# Patient Record
Sex: Male | Born: 1935 | Race: White | Hispanic: No | Marital: Married | Smoking: Former smoker
Health system: Southern US, Community
[De-identification: ages and names within clinical notes are randomized; demographics above are authoritative.]

## PROBLEM LIST (undated history)

## (undated) DIAGNOSIS — N400 Enlarged prostate without lower urinary tract symptoms: Secondary | ICD-10-CM

---

## 2008-04-15 ENCOUNTER — Encounter: Admission: RE | Admit: 2008-04-15 | Discharge: 2008-04-15 | Payer: Self-pay | Admitting: Gastroenterology

## 2009-03-18 ENCOUNTER — Encounter: Admission: RE | Admit: 2009-03-18 | Discharge: 2009-03-18 | Payer: Self-pay | Admitting: Family Medicine

## 2009-04-11 ENCOUNTER — Encounter: Admission: RE | Admit: 2009-04-11 | Discharge: 2009-04-11 | Payer: Self-pay | Admitting: Family Medicine

## 2010-06-10 ENCOUNTER — Emergency Department (HOSPITAL_BASED_OUTPATIENT_CLINIC_OR_DEPARTMENT_OTHER): Admission: EM | Admit: 2010-06-10 | Discharge: 2010-06-11 | Payer: Self-pay | Admitting: Emergency Medicine

## 2018-06-01 ENCOUNTER — Emergency Department (HOSPITAL_BASED_OUTPATIENT_CLINIC_OR_DEPARTMENT_OTHER)
Admission: EM | Admit: 2018-06-01 | Discharge: 2018-06-01 | Disposition: A | Discharge status: Home / Self Care | Payer: Medicare Other | Attending: Emergency Medicine | Admitting: Emergency Medicine

## 2018-06-01 ENCOUNTER — Emergency Department (HOSPITAL_BASED_OUTPATIENT_CLINIC_OR_DEPARTMENT_OTHER): Payer: Medicare Other

## 2018-06-01 ENCOUNTER — Encounter (HOSPITAL_BASED_OUTPATIENT_CLINIC_OR_DEPARTMENT_OTHER): Payer: Self-pay | Admitting: Emergency Medicine

## 2018-06-01 ENCOUNTER — Other Ambulatory Visit: Payer: Self-pay

## 2018-06-01 DIAGNOSIS — Z87891 Personal history of nicotine dependence: Secondary | ICD-10-CM | POA: Diagnosis not present

## 2018-06-01 DIAGNOSIS — R61 Generalized hyperhidrosis: Secondary | ICD-10-CM | POA: Insufficient documentation

## 2018-06-01 DIAGNOSIS — R531 Weakness: Secondary | ICD-10-CM | POA: Diagnosis present

## 2018-06-01 DIAGNOSIS — Z85828 Personal history of other malignant neoplasm of skin: Secondary | ICD-10-CM | POA: Diagnosis not present

## 2018-06-01 DIAGNOSIS — N3001 Acute cystitis with hematuria: Secondary | ICD-10-CM | POA: Diagnosis not present

## 2018-06-01 HISTORY — DX: Benign prostatic hyperplasia without lower urinary tract symptoms: N40.0

## 2018-06-01 LAB — URINALYSIS, ROUTINE W REFLEX MICROSCOPIC
BILIRUBIN URINE: NEGATIVE
Glucose, UA: NEGATIVE mg/dL
Ketones, ur: NEGATIVE mg/dL
Nitrite: NEGATIVE
PH: 6 (ref 5.0–8.0)
Protein, ur: 30 mg/dL — AB
SPECIFIC GRAVITY, URINE: 1.01 (ref 1.005–1.030)

## 2018-06-01 LAB — CBC
HCT: 37.3 % — ABNORMAL LOW (ref 39.0–52.0)
Hemoglobin: 12.9 g/dL — ABNORMAL LOW (ref 13.0–17.0)
MCH: 33.3 pg (ref 26.0–34.0)
MCHC: 34.6 g/dL (ref 30.0–36.0)
MCV: 96.4 fL (ref 78.0–100.0)
Platelets: 210 10*3/uL (ref 150–400)
RBC: 3.87 MIL/uL — ABNORMAL LOW (ref 4.22–5.81)
RDW: 12 % (ref 11.5–15.5)
WBC: 5.5 10*3/uL (ref 4.0–10.5)

## 2018-06-01 LAB — COMPREHENSIVE METABOLIC PANEL
ALK PHOS: 43 U/L (ref 38–126)
ALT: 19 U/L (ref 0–44)
AST: 22 U/L (ref 15–41)
Albumin: 3.1 g/dL — ABNORMAL LOW (ref 3.5–5.0)
Anion gap: 7 (ref 5–15)
BUN: 20 mg/dL (ref 8–23)
CALCIUM: 9.2 mg/dL (ref 8.9–10.3)
CO2: 27 mmol/L (ref 22–32)
CREATININE: 0.68 mg/dL (ref 0.61–1.24)
Chloride: 100 mmol/L (ref 98–111)
Glucose, Bld: 154 mg/dL — ABNORMAL HIGH (ref 70–99)
Potassium: 3.6 mmol/L (ref 3.5–5.1)
Sodium: 134 mmol/L — ABNORMAL LOW (ref 135–145)
Total Bilirubin: 0.7 mg/dL (ref 0.3–1.2)
Total Protein: 6.5 g/dL (ref 6.5–8.1)

## 2018-06-01 LAB — URINALYSIS, MICROSCOPIC (REFLEX): WBC, UA: >50 WBC/hpf (ref 0–5)

## 2018-06-01 LAB — TROPONIN I: Troponin I: <0.03 ng/mL (ref <0.03)

## 2018-06-01 LAB — TSH: TSH: 1.041 u[IU]/mL (ref 0.350–4.500)

## 2018-06-01 LAB — LIPASE, BLOOD: LIPASE: 31 U/L (ref 11–51)

## 2018-06-01 MED ORDER — CEPHALEXIN 500 MG PO CAPS: 500.0000 mg | ORAL_CAPSULE | Freq: Two times a day (BID) | ORAL | 0 refills | Status: AC

## 2018-06-01 NOTE — ED Provider Notes (Signed)
Donald Conrad Provider Note   CSN: [id number] Arrival date & time: 06/01/18  1130     History   Chief Complaint Chief Complaint  Patient presents with  . Weakness    HPI Donald Conrad is a 84 y.o. male past medical history of GERD, BPH, squamous cell carcinoma of the head who presents for evaluation of 3 weeks of generalized weakness, increased fatigue, night sweats, decreased appetite.  Patient reports that on 05/12/2018, he was treated for UTI.  His culture showed resistance to Bactrim which he had been on so he was switched to Keflex.  Patient reports that since then, he has not been back to baseline.  He states that he has no energy and is very tired.  Patient reports that he has had a frequent night sweats and states that throughout his night he has to change his pajamas several times.  Patient reports that he has not wanted to eat or drink anything.  He denies any nausea/vomiting or abdominal pain.  Patient reports subjective fever chills that is been intermittent for the last 3 weeks.  He states he has not actually measured a temperature but just felt like he has not been able to regulate.  Patient also reports that he has had increased urinary frequency and states that his urine has been very cloudy.  Patient also states that he feels very unsteady on his feet like he might fall down.  He denies any room spinning sensation.  Patient denies any chest pain, difficulty breathing, abdominal pain, nausea/vomiting, vision changes, speech difficulty, numbness/weakness of his arms or legs.  The history is provided by the patient.    Past Medical History:  Diagnosis Date  . Enlarged prostate     There are no active problems to display for this patient.   History reviewed. No pertinent surgical history.      Home Medications    Prior to Admission medications   Medication Sig Start Date End Date Taking? Authorizing Provider  cephALEXin (KEFLEX) 500 MG  capsule Take 1 capsule (500 mg total) by mouth 2 (two) times daily for 7 days. 06/01/18 06/08/18  Volanda Napoleon, PA-C    Family History No family history on file.  Social History Social History   Tobacco Use  . Smoking status: Former Research scientist (life sciences)  . Smokeless tobacco: Never Used  Substance Use Topics  . Alcohol use: Yes    Alcohol/week: 1.8 oz    Types: 3 Glasses of wine per week    Comment: daily  . Drug use: Never     Allergies   Bactrim [sulfamethoxazole-trimethoprim]   Review of Systems Review of Systems  Constitutional: Positive for appetite change, chills, diaphoresis, fatigue and fever.  HENT: Negative for congestion.   Eyes: Negative for visual disturbance.  Respiratory: Negative for cough and shortness of breath.   Cardiovascular: Negative for chest pain.  Gastrointestinal: Negative for abdominal pain, diarrhea, nausea and vomiting.  Genitourinary: Positive for frequency. Negative for dysuria and hematuria.  Musculoskeletal: Negative for back pain and neck pain.  Neurological: Positive for light-headedness. Negative for dizziness, weakness, numbness and headaches.  All other systems reviewed and are negative.    Physical Exam Updated Vital Signs BP 132/64   Pulse (!) 57   Temp 98.2 F (36.8 C)   Resp 11   Ht 5' 7.75" (1.721 m)   Wt 60.3 kg (133 lb)   SpO2 100%   BMI 20.37 kg/m   Physical Exam  Constitutional: He  is oriented to person, place, and time. He appears well-developed and well-nourished.  HENT:  Head: Normocephalic and atraumatic.  Mouth/Throat: Oropharynx is clear and moist and mucous membranes are normal.  Eyes: Pupils are equal, round, and reactive to light. Conjunctivae, EOM and lids are normal.  Neck: Full passive range of motion without pain.  Cardiovascular: Normal rate, regular rhythm, normal heart sounds and normal pulses. Exam reveals no gallop and no friction rub.  No murmur heard. Pulmonary/Chest: Effort normal and breath sounds  normal.  Lungs clear to auscultation bilaterally.  Symmetric chest rise.  No wheezing, rales, rhonchi.  Abdominal: Soft. Normal appearance and bowel sounds are normal. There is no tenderness. There is no rigidity and no guarding.  Slight abdominal protuberance.   Musculoskeletal: Normal range of motion.  Neurological: He is alert and oriented to person, place, and time.  Cranial nerves III-XII intact Follows commands, Moves all extremities  5/5 strength to BUE and BLE  Sensation intact throughout all major nerve distributions Normal finger to nose. No dysdiadochokinesia. No pronator drift. No gait abnormalities  No slurred speech. No facial droop.   Skin: Skin is warm and dry. Capillary refill takes less than 2 seconds.  Psychiatric: He has a normal mood and affect. His speech is normal.  Nursing note and vitals reviewed.    ED Treatments / Results  Labs (all labs ordered are listed, but only abnormal results are displayed) Labs Reviewed  CBC - Abnormal; Notable for the following components:      Result Value   RBC 3.87 (*)    Hemoglobin 12.9 (*)    HCT 37.3 (*)    All other components within normal limits  URINALYSIS, ROUTINE W REFLEX MICROSCOPIC - Abnormal; Notable for the following components:   APPearance CLOUDY (*)    Hgb urine dipstick MODERATE (*)    Protein, ur 30 (*)    Leukocytes, UA LARGE (*)    All other components within normal limits  COMPREHENSIVE METABOLIC PANEL - Abnormal; Notable for the following components:   Sodium 134 (*)    Glucose, Bld 154 (*)    Albumin 3.1 (*)    All other components within normal limits  URINALYSIS, MICROSCOPIC (REFLEX) - Abnormal; Notable for the following components:   Bacteria, UA MANY (*)    All other components within normal limits  URINE CULTURE  TROPONIN I  LIPASE, BLOOD  TSH    EKG EKG Interpretation  Date/Time:  Sunday June 01 2018 11:45:19 EDT Ventricular Rate:  75 PR Interval:  166 QRS Duration: 90 QT  Interval:  348 QTC Calculation: 388 R Axis:   44 Text Interpretation:  Normal sinus rhythm Increased R/S ratio in V1, consider early transition or posterior infarct Abnormal ECG When compared to prior, no significant changes seen.  No STEMI Confirmed by Antony Blackbird [phone]) on 06/01/2018 11:48:51 AM Also confirmed by Antony Blackbird [phone]), editor Lynder Parents [phone])  on 06/01/2018 11:54:15 AM   Radiology Dg Chest 2 View  Result Date: 06/01/2018 CLINICAL DATA:  Weakness and fatigue.  Urinary tract infection. EXAM: CHEST - 2 VIEW COMPARISON:  03/18/2009 FINDINGS: Thoracic spondylosis. The lungs appear clear. Cardiac and mediastinal margins appear normal. IMPRESSION: 1.  No active cardiopulmonary disease is radiographically apparent. 2. Thoracic spondylosis. Electronically Signed   By: Van Clines M.D.   On: 06/01/2018 13:06   Ct Head Wo Contrast  Result Date: 06/01/2018 CLINICAL DATA:  Weakness and fatigue. EXAM: CT HEAD WITHOUT CONTRAST TECHNIQUE: Contiguous axial images  were obtained from the base of the skull through the vertex without intravenous contrast. COMPARISON:  None. FINDINGS: Brain: Calcifications observed along the tentorium and falx. The brainstem, cerebellum, cerebral peduncles, thalami, basal ganglia, basilar cisterns, and ventricular system appear within normal limits. No intracranial hemorrhage, mass lesion, or acute CVA. Vascular: Unremarkable Skull: Unremarkable Sinuses/Orbits: Partial visualization of a suspected mucous retention cyst in left maxillary sinus on image 1/3. Mild chronic ethmoid sinusitis. Other: No supplemental non-categorized findings. IMPRESSION: 1. No significant intracranial abnormalities. 2. Chronic ethmoid sinusitis with a suspected mucous retention cyst in the left maxillary sinus. Electronically Signed   By: Van Clines M.D.   On: 06/01/2018 13:00    Procedures Procedures (including critical care time)  Medications Ordered in  ED Medications - No data to display   Initial Impression / Assessment and Plan / ED Course  I have reviewed the triage vital signs and the nursing notes.  Pertinent labs & imaging results that were available during my care of the patient were reviewed by me and considered in my medical decision making (see chart for details).     84 year old male who presents for evaluation of 3 weeks of generalized fatigue, decreased appetite, night sweats, subjective fever/chills.  Reports that since he had a UTI on 05/12/2017, he has not felt the same.  No focal weakness, vision changes, difficulty speaking.  No chest pain, difficulty breathing. Patient is afebrile, non-toxic appearing, sitting comfortably on examination table. Vital signs reviewed and stable. No neuro deficits noted on exam.  Lungs clear to auscultation.  Abdomen is slightly protuberant but no tenderness.  Will check basic labs, EKG, troponin, chest x-ray.  Given complaints of unsteadiness, plan CT head for further evaluation.  Troponin negative.  Lipase unremarkable.  CBC without any significant leukocytosis.  Hemoglobin is 12.9 and hematocrit is 37.3.  Platelets are 210.  CMP shows sodium of 134, hyperglycemia with glucose at 154.  UA shows moderate hemoglobin, large leukocytes, pyuria.  Chest x-ray shows no acute infectious etiology.  CT head negative for any acute intracranial abnormality.  Discussed patient with Dr. Sherry Ruffing who independently evaluated patient.  Patient does have hematuria noted on UA.  He does report that he has had a history of kidney stones but is not having any pain.  We discussed at length regarding further evaluation, including CT abdomen pelvis for further evaluation.  Patient wishes not to proceed with any further work-up here in the ED.  We will plan to treat for his UTI.  Urine culture sent.  Previous urine culture was susceptible to Keflex.  We will start him on Keflex.  Encourage primary care follow-up. Patient had  ample opportunity for questions and discussion. All patient's questions were answered with full understanding.   Final Clinical Impressions(s) / ED Diagnoses   Final diagnoses:  Acute cystitis with hematuria    ED Discharge Orders        Ordered    cephALEXin (KEFLEX) 500 MG capsule  2 times daily     06/01/18 1430       Volanda Napoleon, PA-C 06/01/18 1454    Tegeler, Gwenyth Allegra, MD 06/02/18 [phone]

## 2018-06-01 NOTE — Discharge Instructions (Signed)
Take antibiotics as directed. Please take all of your antibiotics until finished.  We have sent your urine for a culture so if you need to be switched on your antibiotics, you will be notified.   Please follow-up with your primary care doctor in the next 2 to 4 days for further evaluation.  Return to emergency department for any fever, abdominal pain, nausea/vomiting, chest pain, difficulty breathing or any other worsening or concerning symptoms.

## 2018-06-01 NOTE — ED Triage Notes (Signed)
Pt c/o generalized weakness, fatigue, night sweats, decreased appetite for 3 weeks. Denies chest pain, SOB, N/V

## 2018-06-04 LAB — URINE CULTURE
Culture: >=100000 — AB
Special Requests: NORMAL

## 2018-06-05 ENCOUNTER — Telehealth: Payer: Self-pay | Admitting: *Deleted

## 2018-06-05 NOTE — Telephone Encounter (Signed)
Post ED Visit - Positive Culture Follow-up  Culture report reviewed by antimicrobial stewardship pharmacist:  []  Elenor Quinones, Pharm.D. []  Heide Guile, Pharm.D., BCPS AQ-ID []  Parks Neptune, Pharm.D., BCPS []  Alycia Rossetti, Pharm.D., BCPS []  Kirby, Pharm.D., BCPS, AAHIVP []  Legrand Como, Pharm.D., BCPS, AAHIVP []  Salome Arnt, PharmD, BCPS []  Johnnette Gourd, PharmD, BCPS []  Hughes Better, PharmD, BCPS []  Leeroy Cha, PharmD Lucio Edward, PharmD  Positive urine culture Treated with Cephalexin, organism sensitive to the same and no further patient follow-up is required at this time.  Harlon Flor [facility] 06/05/2018, 10:30 AM

## 2019-01-21 ENCOUNTER — Ambulatory Visit
Admission: RE | Admit: 2019-01-21 | Discharge: 2019-01-21 | Disposition: A | Discharge status: Home / Self Care | Payer: Medicare Other | Source: Ambulatory Visit | Attending: Family Medicine | Admitting: Family Medicine

## 2019-01-21 ENCOUNTER — Other Ambulatory Visit: Payer: Self-pay | Admitting: Family Medicine

## 2019-01-21 ENCOUNTER — Ambulatory Visit: Payer: Medicare Other

## 2019-01-21 DIAGNOSIS — N6310 Unspecified lump in the right breast, unspecified quadrant: Secondary | ICD-10-CM

## 2019-08-02 IMAGING — CR DG CHEST 2V
2 series · 2 of 2 positions shown · non-contrast
Comparison: 03/18/2009

CLINICAL DATA: Weakness and fatigue.  Urinary tract infection.

EXAM:
CHEST - 2 VIEW

[w chest pa]
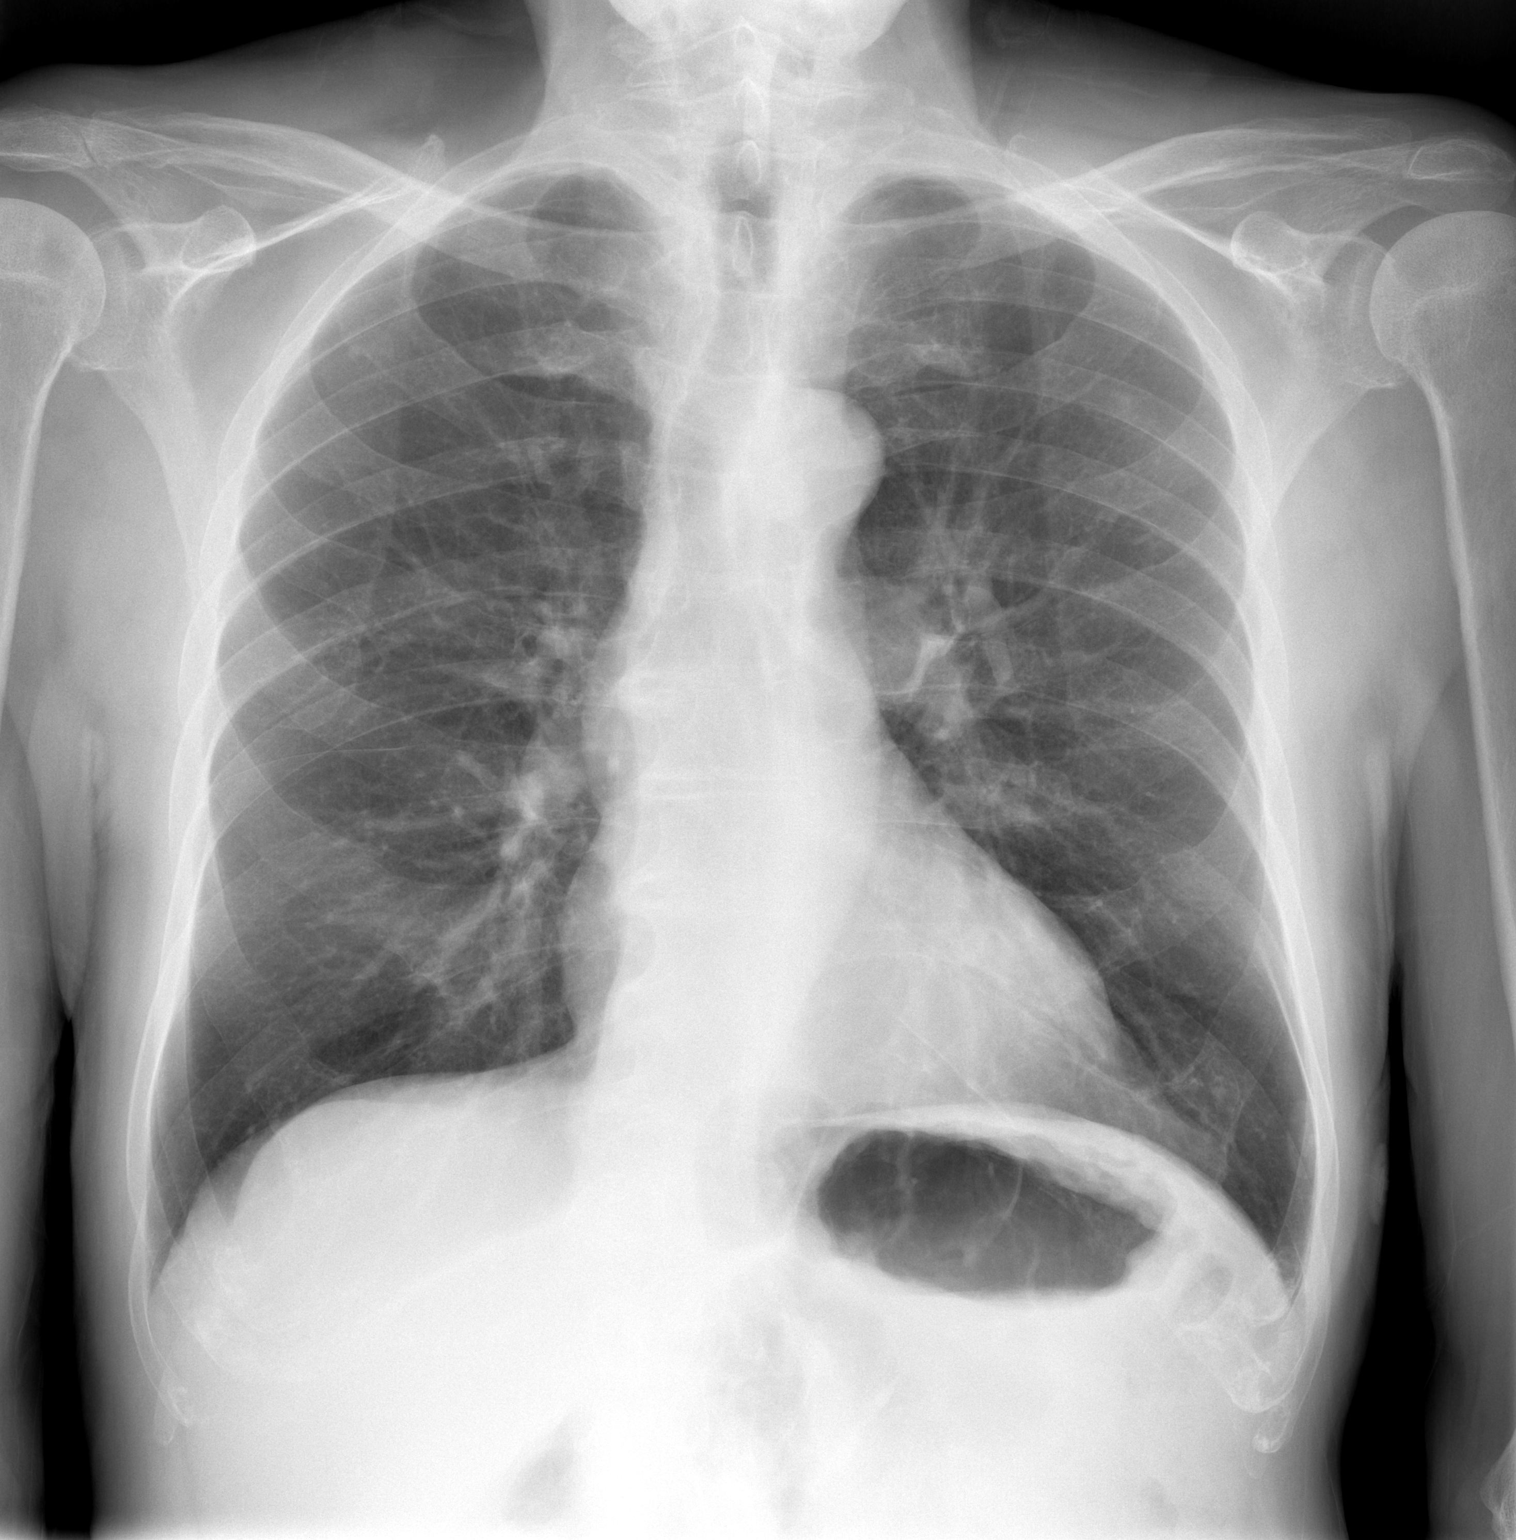

[w chest lat]
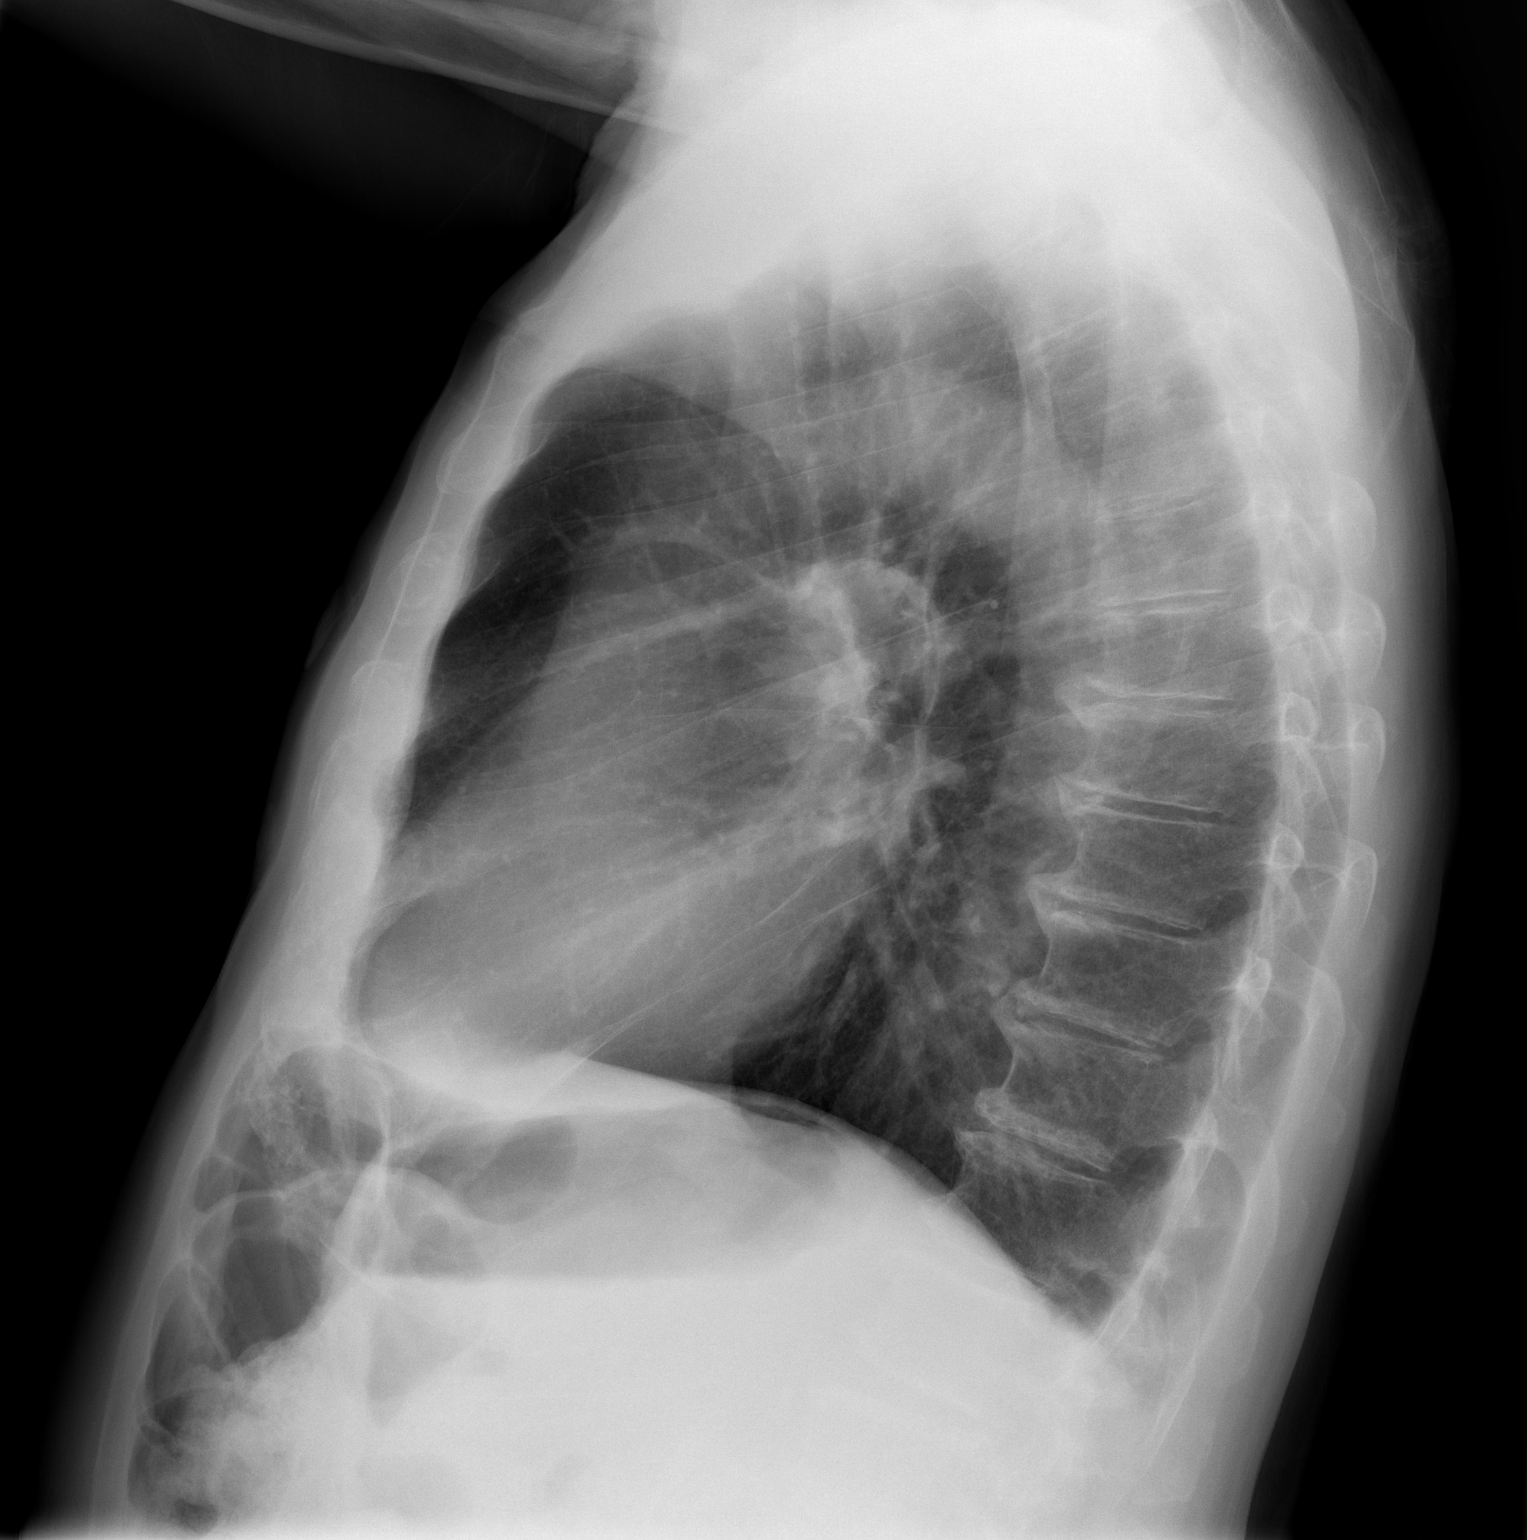

[2 of 2 positions shown; findings below may reference images not displayed]

FINDINGS: Thoracic spondylosis. The lungs appear clear. Cardiac and
mediastinal margins appear normal.
IMPRESSION: 1.  No active cardiopulmonary disease is radiographically apparent.
2. Thoracic spondylosis.

## 2019-08-02 IMAGING — CT CT HEAD W/O CM
3 series · 16 of 47 positions shown, 19 images · non-contrast
Comparison: None.

CLINICAL DATA: Weakness and fatigue.

EXAM:
CT HEAD WITHOUT CONTRAST
TECHNIQUE: Contiguous axial images were obtained from the base of the skull
through the vertex without intravenous contrast.

[Series 2: head wo · axial · 0.44mm/px · z∈[+788,+933]mm · 10 of 35 slices shown, 13 images]
[im 3/35  brain]
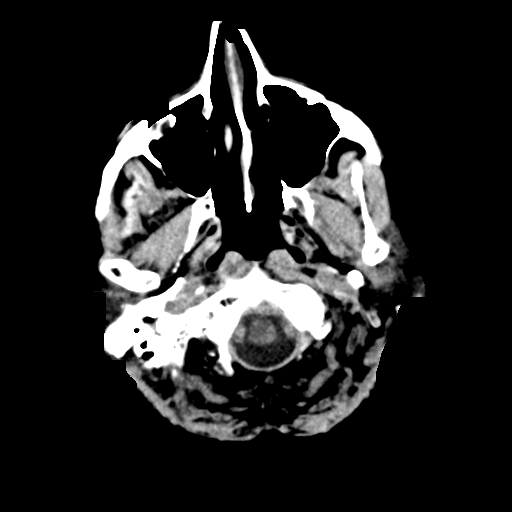
[im 3/35  bone]
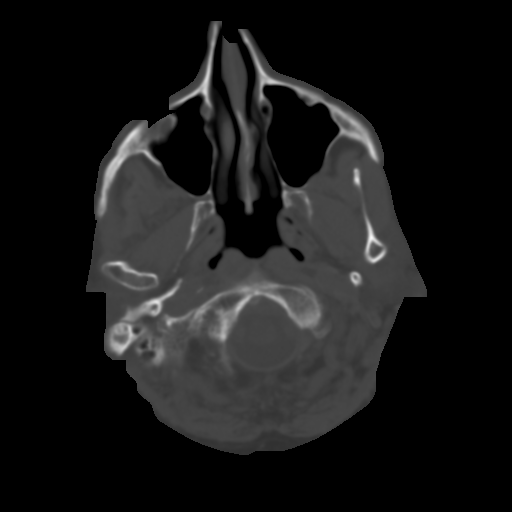
[im 6/35  brain]
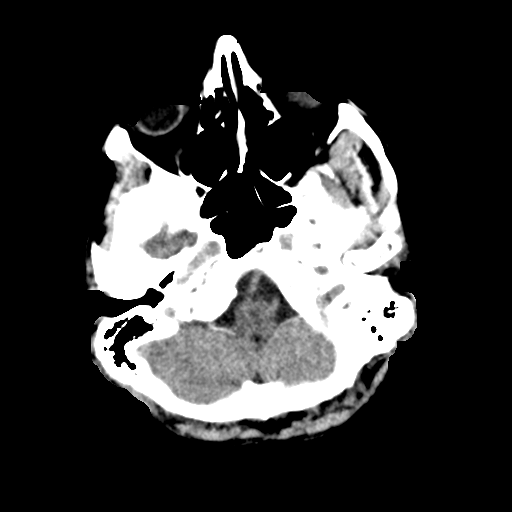
[im 10/35  brain]
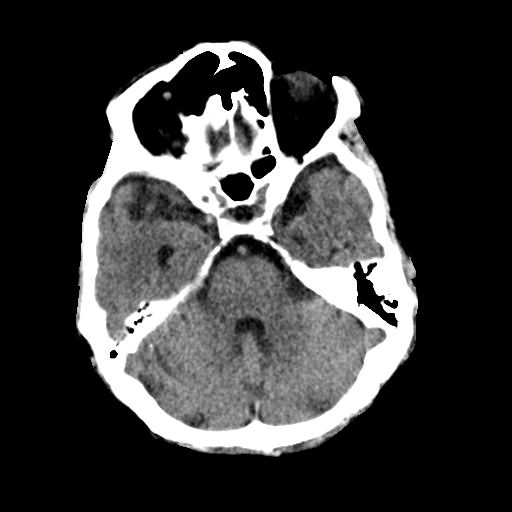
[im 12/35  brain]
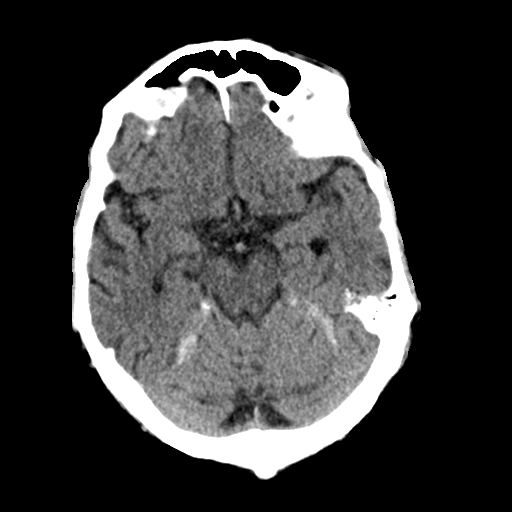
[im 16/35  brain]
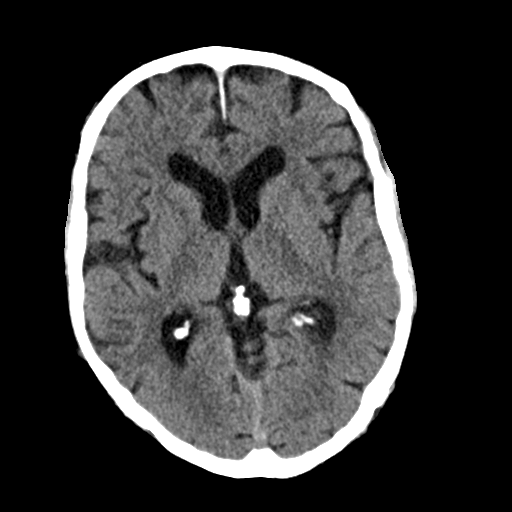
[im 16/35  bone]
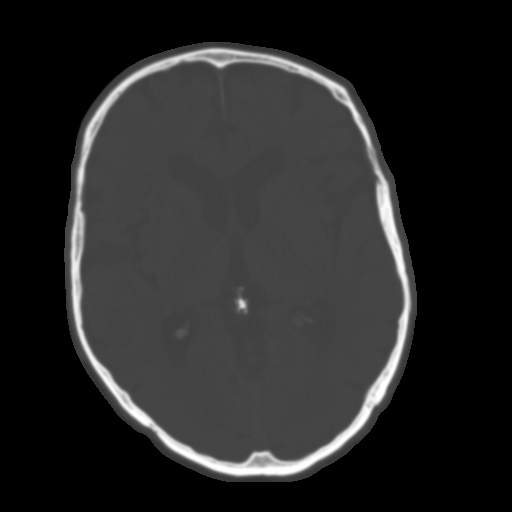
[im 19/35  brain]
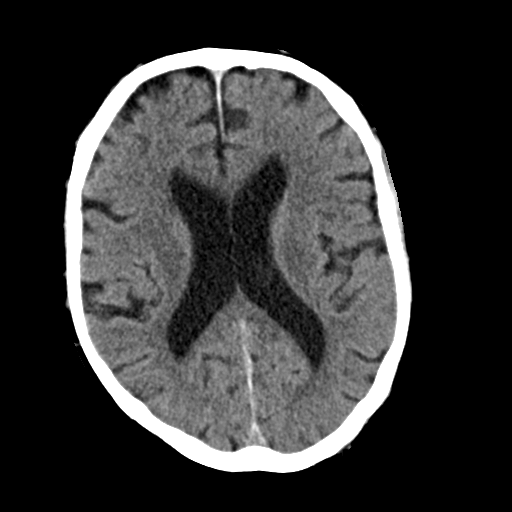
[im 23/35  brain]
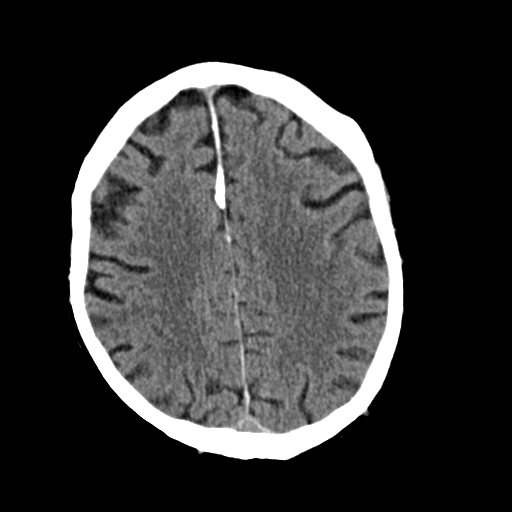
[im 26/35  brain]
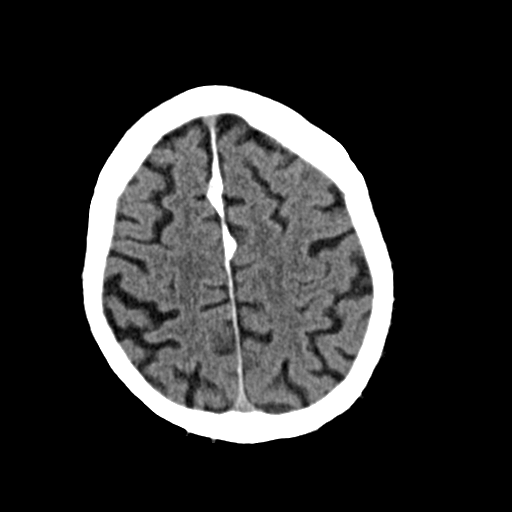
[im 29/35  brain]
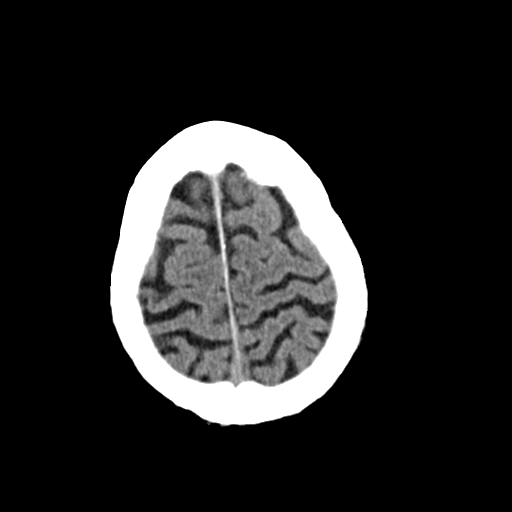
[im 29/35  bone]
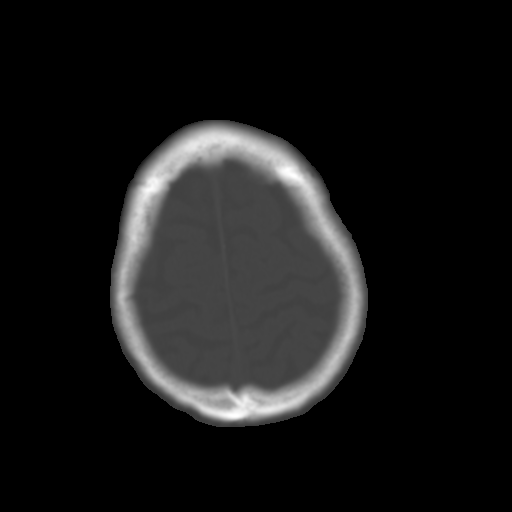
[im 32/35  brain]
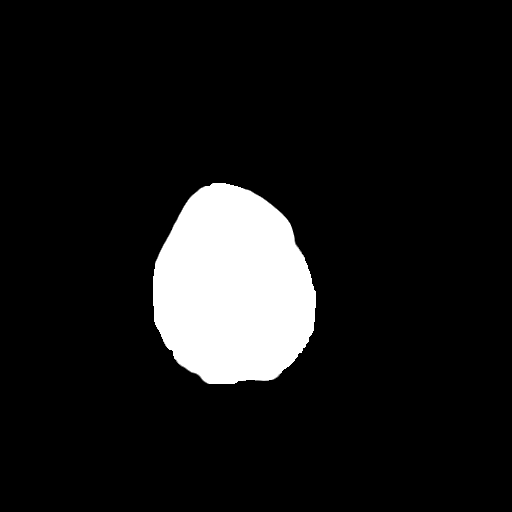

[Series 4: coronal soft · coronal · 0.35mm/px · 3 of 73 slices shown]
[im 25/73  brain]
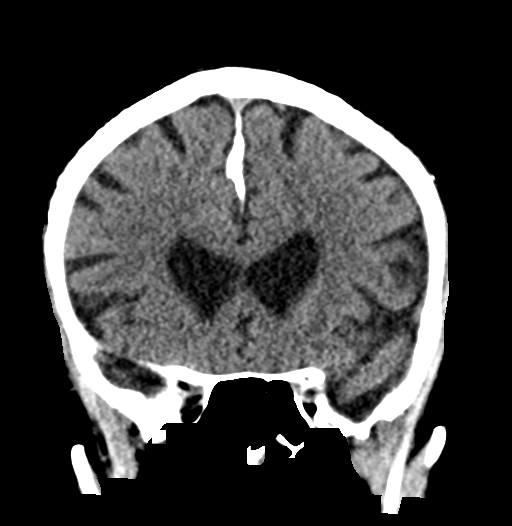
[im 33/73  brain]
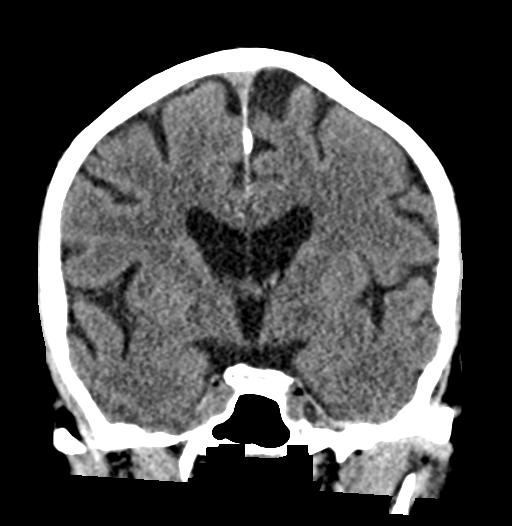
[im 41/73  brain]
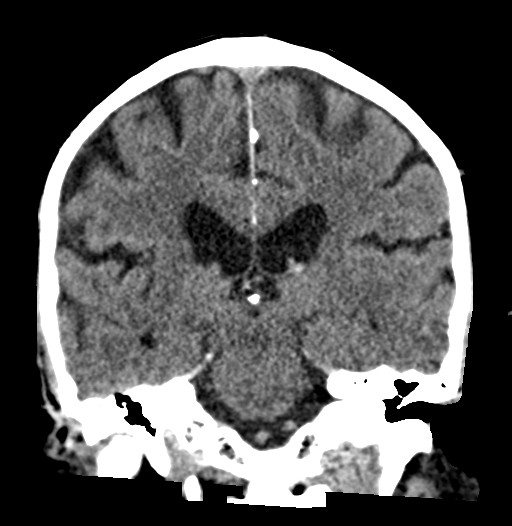

[Series 5: sag soft · sagittal · 0.38mm/px · 3 of 57 slices shown]
[im 19/57  brain]
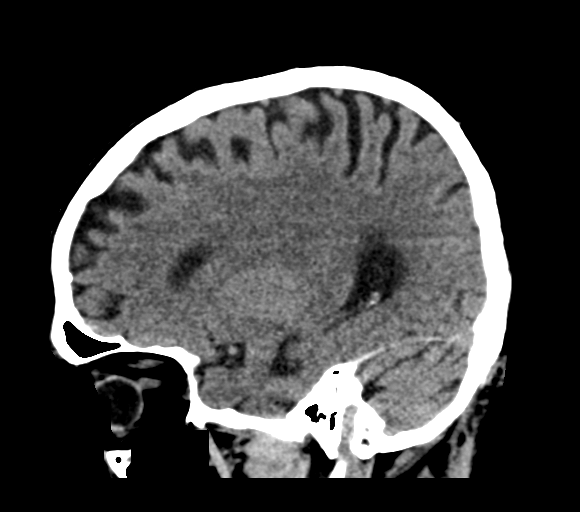
[im 29/57  brain]
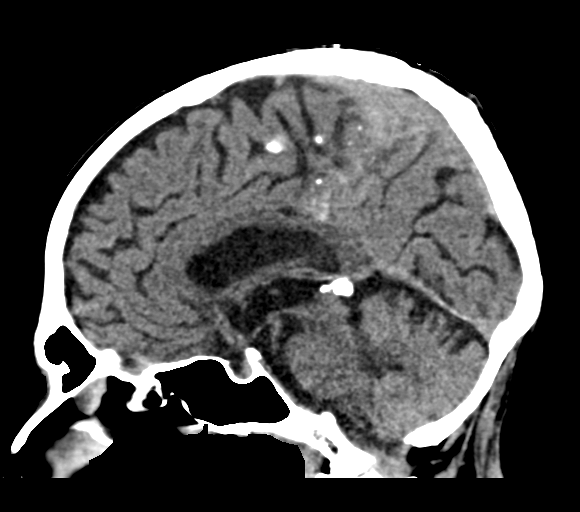
[im 38/57  brain]
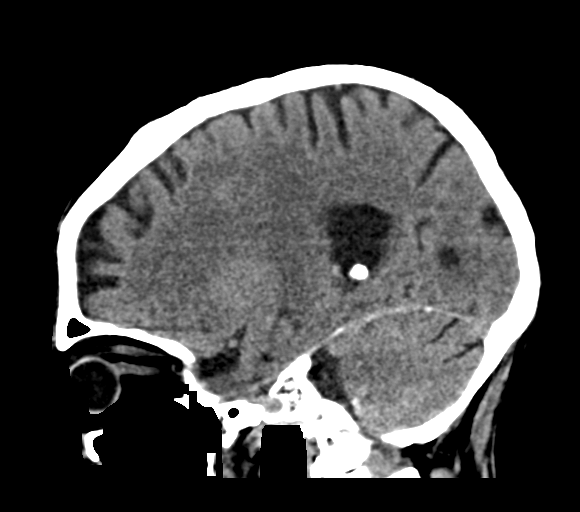

[16 of 47 positions shown; findings below may reference images not displayed]

FINDINGS: Brain: Calcifications observed along the tentorium and falx.

The brainstem, cerebellum, cerebral peduncles, thalami, basal
ganglia, basilar cisterns, and ventricular system appear within
normal limits. No intracranial hemorrhage, mass lesion, or acute
CVA.

Vascular: Unremarkable

Skull: Unremarkable

Sinuses/Orbits: Partial visualization of a suspected mucous
retention cyst in left maxillary sinus on image [DATE]. Mild chronic
ethmoid sinusitis.

Other: No supplemental non-categorized findings.
IMPRESSION: 1. No significant intracranial abnormalities.
2. Chronic ethmoid sinusitis with a suspected mucous retention cyst
in the left maxillary sinus.

## 2021-03-16 DIAGNOSIS — Z85828 Personal history of other malignant neoplasm of skin: Secondary | ICD-10-CM | POA: Diagnosis not present

## 2021-03-16 DIAGNOSIS — L570 Actinic keratosis: Secondary | ICD-10-CM | POA: Diagnosis not present

## 2021-03-16 DIAGNOSIS — D0439 Carcinoma in situ of skin of other parts of face: Secondary | ICD-10-CM | POA: Diagnosis not present

## 2021-03-16 DIAGNOSIS — L821 Other seborrheic keratosis: Secondary | ICD-10-CM | POA: Diagnosis not present

## 2021-03-22 DIAGNOSIS — Z1389 Encounter for screening for other disorder: Secondary | ICD-10-CM | POA: Diagnosis not present

## 2021-03-22 DIAGNOSIS — C801 Malignant (primary) neoplasm, unspecified: Secondary | ICD-10-CM | POA: Diagnosis not present

## 2021-03-22 DIAGNOSIS — I10 Essential (primary) hypertension: Secondary | ICD-10-CM | POA: Diagnosis not present

## 2021-03-22 DIAGNOSIS — E7800 Pure hypercholesterolemia, unspecified: Secondary | ICD-10-CM | POA: Diagnosis not present

## 2021-03-22 DIAGNOSIS — C4491 Basal cell carcinoma of skin, unspecified: Secondary | ICD-10-CM | POA: Diagnosis not present

## 2021-03-22 DIAGNOSIS — K219 Gastro-esophageal reflux disease without esophagitis: Secondary | ICD-10-CM | POA: Diagnosis not present

## 2021-03-22 DIAGNOSIS — Z0000 Encounter for general adult medical examination without abnormal findings: Secondary | ICD-10-CM | POA: Diagnosis not present

## 2021-06-15 DIAGNOSIS — L570 Actinic keratosis: Secondary | ICD-10-CM | POA: Diagnosis not present

## 2021-10-04 DIAGNOSIS — H43393 Other vitreous opacities, bilateral: Secondary | ICD-10-CM | POA: Diagnosis not present

## 2021-10-04 DIAGNOSIS — H43813 Vitreous degeneration, bilateral: Secondary | ICD-10-CM | POA: Diagnosis not present

## 2021-10-04 DIAGNOSIS — H52203 Unspecified astigmatism, bilateral: Secondary | ICD-10-CM | POA: Diagnosis not present

## 2021-10-04 DIAGNOSIS — H35371 Puckering of macula, right eye: Secondary | ICD-10-CM | POA: Diagnosis not present

## 2021-10-04 DIAGNOSIS — H26491 Other secondary cataract, right eye: Secondary | ICD-10-CM | POA: Diagnosis not present

## 2021-10-04 DIAGNOSIS — H524 Presbyopia: Secondary | ICD-10-CM | POA: Diagnosis not present

## 2021-10-04 DIAGNOSIS — Z961 Presence of intraocular lens: Secondary | ICD-10-CM | POA: Diagnosis not present

## 2022-03-27 DIAGNOSIS — E7800 Pure hypercholesterolemia, unspecified: Secondary | ICD-10-CM | POA: Diagnosis not present

## 2022-03-27 DIAGNOSIS — C801 Malignant (primary) neoplasm, unspecified: Secondary | ICD-10-CM | POA: Diagnosis not present

## 2022-03-27 DIAGNOSIS — K219 Gastro-esophageal reflux disease without esophagitis: Secondary | ICD-10-CM | POA: Diagnosis not present

## 2022-03-27 DIAGNOSIS — Z6822 Body mass index (BMI) 22.0-22.9, adult: Secondary | ICD-10-CM | POA: Diagnosis not present

## 2022-03-27 DIAGNOSIS — I10 Essential (primary) hypertension: Secondary | ICD-10-CM | POA: Diagnosis not present

## 2022-03-27 DIAGNOSIS — Z0000 Encounter for general adult medical examination without abnormal findings: Secondary | ICD-10-CM | POA: Diagnosis not present

## 2022-03-27 DIAGNOSIS — C4491 Basal cell carcinoma of skin, unspecified: Secondary | ICD-10-CM | POA: Diagnosis not present

## 2022-06-20 DIAGNOSIS — L814 Other melanin hyperpigmentation: Secondary | ICD-10-CM | POA: Diagnosis not present

## 2022-06-20 DIAGNOSIS — D485 Neoplasm of uncertain behavior of skin: Secondary | ICD-10-CM | POA: Diagnosis not present

## 2022-06-20 DIAGNOSIS — D1801 Hemangioma of skin and subcutaneous tissue: Secondary | ICD-10-CM | POA: Diagnosis not present

## 2022-06-20 DIAGNOSIS — L821 Other seborrheic keratosis: Secondary | ICD-10-CM | POA: Diagnosis not present

## 2022-06-20 DIAGNOSIS — D044 Carcinoma in situ of skin of scalp and neck: Secondary | ICD-10-CM | POA: Diagnosis not present

## 2022-06-20 DIAGNOSIS — L570 Actinic keratosis: Secondary | ICD-10-CM | POA: Diagnosis not present

## 2022-06-20 DIAGNOSIS — C4442 Squamous cell carcinoma of skin of scalp and neck: Secondary | ICD-10-CM | POA: Diagnosis not present

## 2022-06-20 DIAGNOSIS — D225 Melanocytic nevi of trunk: Secondary | ICD-10-CM | POA: Diagnosis not present

## 2022-06-20 DIAGNOSIS — Z85828 Personal history of other malignant neoplasm of skin: Secondary | ICD-10-CM | POA: Diagnosis not present

## 2022-07-12 DIAGNOSIS — D044 Carcinoma in situ of skin of scalp and neck: Secondary | ICD-10-CM | POA: Diagnosis not present

## 2023-04-03 DIAGNOSIS — U071 COVID-19: Secondary | ICD-10-CM | POA: Diagnosis not present

## 2023-04-18 DIAGNOSIS — Z9181 History of falling: Secondary | ICD-10-CM | POA: Diagnosis not present

## 2023-04-18 DIAGNOSIS — I10 Essential (primary) hypertension: Secondary | ICD-10-CM | POA: Diagnosis not present

## 2023-04-18 DIAGNOSIS — E7800 Pure hypercholesterolemia, unspecified: Secondary | ICD-10-CM | POA: Diagnosis not present

## 2023-04-18 DIAGNOSIS — Z0000 Encounter for general adult medical examination without abnormal findings: Secondary | ICD-10-CM | POA: Diagnosis not present

## 2023-04-18 DIAGNOSIS — K219 Gastro-esophageal reflux disease without esophagitis: Secondary | ICD-10-CM | POA: Diagnosis not present

## 2023-04-30 DIAGNOSIS — H524 Presbyopia: Secondary | ICD-10-CM | POA: Diagnosis not present

## 2023-04-30 DIAGNOSIS — H35371 Puckering of macula, right eye: Secondary | ICD-10-CM | POA: Diagnosis not present

## 2023-04-30 DIAGNOSIS — H43393 Other vitreous opacities, bilateral: Secondary | ICD-10-CM | POA: Diagnosis not present

## 2023-04-30 DIAGNOSIS — Z961 Presence of intraocular lens: Secondary | ICD-10-CM | POA: Diagnosis not present

## 2023-04-30 DIAGNOSIS — H43813 Vitreous degeneration, bilateral: Secondary | ICD-10-CM | POA: Diagnosis not present

## 2023-04-30 DIAGNOSIS — H26493 Other secondary cataract, bilateral: Secondary | ICD-10-CM | POA: Diagnosis not present

## 2023-04-30 DIAGNOSIS — H52203 Unspecified astigmatism, bilateral: Secondary | ICD-10-CM | POA: Diagnosis not present

## 2023-04-30 DIAGNOSIS — H5201 Hypermetropia, right eye: Secondary | ICD-10-CM | POA: Diagnosis not present

## 2023-08-26 DIAGNOSIS — D225 Melanocytic nevi of trunk: Secondary | ICD-10-CM | POA: Diagnosis not present

## 2023-08-26 DIAGNOSIS — D485 Neoplasm of uncertain behavior of skin: Secondary | ICD-10-CM | POA: Diagnosis not present

## 2023-08-26 DIAGNOSIS — L821 Other seborrheic keratosis: Secondary | ICD-10-CM | POA: Diagnosis not present

## 2023-08-26 DIAGNOSIS — C44729 Squamous cell carcinoma of skin of left lower limb, including hip: Secondary | ICD-10-CM | POA: Diagnosis not present

## 2023-08-26 DIAGNOSIS — D692 Other nonthrombocytopenic purpura: Secondary | ICD-10-CM | POA: Diagnosis not present

## 2023-08-26 DIAGNOSIS — D2272 Melanocytic nevi of left lower limb, including hip: Secondary | ICD-10-CM | POA: Diagnosis not present

## 2023-08-26 DIAGNOSIS — L720 Epidermal cyst: Secondary | ICD-10-CM | POA: Diagnosis not present

## 2023-08-26 DIAGNOSIS — D1801 Hemangioma of skin and subcutaneous tissue: Secondary | ICD-10-CM | POA: Diagnosis not present

## 2023-08-26 DIAGNOSIS — L570 Actinic keratosis: Secondary | ICD-10-CM | POA: Diagnosis not present

## 2023-08-26 DIAGNOSIS — Z85828 Personal history of other malignant neoplasm of skin: Secondary | ICD-10-CM | POA: Diagnosis not present

## 2023-10-03 DIAGNOSIS — C44729 Squamous cell carcinoma of skin of left lower limb, including hip: Secondary | ICD-10-CM | POA: Diagnosis not present

## 2023-10-03 DIAGNOSIS — L988 Other specified disorders of the skin and subcutaneous tissue: Secondary | ICD-10-CM | POA: Diagnosis not present

## 2023-10-17 DIAGNOSIS — I10 Essential (primary) hypertension: Secondary | ICD-10-CM | POA: Diagnosis not present

## 2023-10-17 DIAGNOSIS — E7800 Pure hypercholesterolemia, unspecified: Secondary | ICD-10-CM | POA: Diagnosis not present

## 2023-10-17 DIAGNOSIS — Z85828 Personal history of other malignant neoplasm of skin: Secondary | ICD-10-CM | POA: Diagnosis not present

## 2023-10-17 DIAGNOSIS — Z23 Encounter for immunization: Secondary | ICD-10-CM | POA: Diagnosis not present

## 2024-02-12 DIAGNOSIS — S76112A Strain of left quadriceps muscle, fascia and tendon, initial encounter: Secondary | ICD-10-CM | POA: Diagnosis not present

## 2024-02-25 ENCOUNTER — Other Ambulatory Visit: Payer: Self-pay | Admitting: Family Medicine

## 2024-02-25 ENCOUNTER — Ambulatory Visit
Admission: RE | Admit: 2024-02-25 | Discharge: 2024-02-25 | Disposition: A | Discharge status: Home / Self Care | Source: Ambulatory Visit | Attending: Family Medicine | Admitting: Family Medicine

## 2024-02-25 DIAGNOSIS — M25552 Pain in left hip: Secondary | ICD-10-CM

## 2024-06-01 DIAGNOSIS — J069 Acute upper respiratory infection, unspecified: Secondary | ICD-10-CM | POA: Diagnosis not present

## 2024-06-01 DIAGNOSIS — H6123 Impacted cerumen, bilateral: Secondary | ICD-10-CM | POA: Diagnosis not present

## 2024-06-04 DIAGNOSIS — H52203 Unspecified astigmatism, bilateral: Secondary | ICD-10-CM | POA: Diagnosis not present

## 2024-06-04 DIAGNOSIS — Z961 Presence of intraocular lens: Secondary | ICD-10-CM | POA: Diagnosis not present

## 2024-06-04 DIAGNOSIS — H43813 Vitreous degeneration, bilateral: Secondary | ICD-10-CM | POA: Diagnosis not present

## 2024-06-04 DIAGNOSIS — H26492 Other secondary cataract, left eye: Secondary | ICD-10-CM | POA: Diagnosis not present

## 2024-06-04 DIAGNOSIS — H26491 Other secondary cataract, right eye: Secondary | ICD-10-CM | POA: Diagnosis not present

## 2024-06-04 DIAGNOSIS — H524 Presbyopia: Secondary | ICD-10-CM | POA: Diagnosis not present

## 2024-06-04 DIAGNOSIS — H35371 Puckering of macula, right eye: Secondary | ICD-10-CM | POA: Diagnosis not present

## 2024-06-09 DIAGNOSIS — H26491 Other secondary cataract, right eye: Secondary | ICD-10-CM | POA: Diagnosis not present

## 2024-07-01 DIAGNOSIS — H524 Presbyopia: Secondary | ICD-10-CM | POA: Diagnosis not present

## 2024-07-01 DIAGNOSIS — H52203 Unspecified astigmatism, bilateral: Secondary | ICD-10-CM | POA: Diagnosis not present

## 2024-07-01 DIAGNOSIS — H5203 Hypermetropia, bilateral: Secondary | ICD-10-CM | POA: Diagnosis not present

## 2024-09-11 ENCOUNTER — Encounter (HOSPITAL_BASED_OUTPATIENT_CLINIC_OR_DEPARTMENT_OTHER): Payer: Self-pay

## 2024-09-11 ENCOUNTER — Emergency Department (HOSPITAL_BASED_OUTPATIENT_CLINIC_OR_DEPARTMENT_OTHER)

## 2024-09-11 ENCOUNTER — Other Ambulatory Visit: Payer: Self-pay

## 2024-09-11 ENCOUNTER — Emergency Department (HOSPITAL_BASED_OUTPATIENT_CLINIC_OR_DEPARTMENT_OTHER): Admission: EM | Admit: 2024-09-11 | Discharge: 2024-09-11 | Disposition: A | Discharge status: Home / Self Care

## 2024-09-11 DIAGNOSIS — R0602 Shortness of breath: Secondary | ICD-10-CM | POA: Diagnosis not present

## 2024-09-11 DIAGNOSIS — Z87891 Personal history of nicotine dependence: Secondary | ICD-10-CM | POA: Insufficient documentation

## 2024-09-11 LAB — TROPONIN T, HIGH SENSITIVITY
Troponin T High Sensitivity: 30 ng/L — ABNORMAL HIGH (ref 0–19)
Troponin T High Sensitivity: 29 ng/L — ABNORMAL HIGH (ref 0–19)

## 2024-09-11 LAB — BASIC METABOLIC PANEL WITH GFR
Anion gap: 11 (ref 5–15)
BUN: 20 mg/dL (ref 8–23)
CO2: 24 mmol/L (ref 22–32)
Calcium: 9.3 mg/dL (ref 8.9–10.3)
Chloride: 102 mmol/L (ref 98–111)
Creatinine, Ser: 0.72 mg/dL (ref 0.61–1.24)
GFR, Estimated: >60 mL/min (ref >60)
Glucose, Bld: 160 mg/dL — ABNORMAL HIGH (ref 70–99)
Potassium: 4.2 mmol/L (ref 3.5–5.1)
Sodium: 136 mmol/L (ref 135–145)

## 2024-09-11 LAB — CBC
HCT: 42.1 % (ref 39.0–52.0)
Hemoglobin: 14.6 g/dL (ref 13.0–17.0)
MCH: 34 pg (ref 26.0–34.0)
MCHC: 34.7 g/dL (ref 30.0–36.0)
MCV: 97.9 fL (ref 80.0–100.0)
Platelets: 193 K/uL (ref 150–400)
RBC: 4.3 MIL/uL (ref 4.22–5.81)
RDW: 12.5 % (ref 11.5–15.5)
WBC: 4.2 K/uL (ref 4.0–10.5)
nRBC: 0 % (ref 0.0–0.2)

## 2024-09-11 MED ORDER — IPRATROPIUM-ALBUTEROL 0.5-2.5 (3) MG/3ML IN SOLN
3.0000 mL | Freq: Once | RESPIRATORY_TRACT | Status: AC
  Administered 2024-09-11: 3 mL via RESPIRATORY_TRACT
  Filled 2024-09-11: qty 3

## 2024-09-11 MED ORDER — ALBUTEROL SULFATE HFA 108 (90 BASE) MCG/ACT IN AERS: 2.0000 | INHALATION_SPRAY | RESPIRATORY_TRACT | 0 refills | Status: AC | PRN

## 2024-09-11 NOTE — ED Provider Notes (Signed)
 [place] [facility] Provider Note   CSN: [id number] Arrival date & time: 09/11/24  9273     Patient presents with: Shortness of Breath   Donald Conrad is a 89 y.o. male.   89 year old male with no reported past medical history presenting to the emergency department today with some shortness of breath.  The patient states that on Tuesday that there was some construction done in his house and he was sweeping up some dust from popcorn ceiling that was removed.  He states that he noted later in the day that he was having some mild shortness of breath.  He states that this has persisted and may have even gotten a little worse.  He denies any associated chest pain.  He came to the emergency department for further evaluation regarding this due to ongoing symptoms.   Shortness of Breath      Prior to Admission medications   Medication Sig Start Date End Date Taking? Authorizing Provider  albuterol (VENTOLIN HFA) 108 (90 Base) MCG/ACT inhaler Inhale 2 puffs into the lungs every 4 (four) hours as needed for wheezing or shortness of breath. 09/11/24  Yes Ula Prentice SAUNDERS, MD    Allergies: Bactrim [sulfamethoxazole-trimethoprim]    Review of Systems  Respiratory:  Positive for shortness of breath.   All other systems reviewed and are negative.   Updated Vital Signs BP (!) 148/78   Pulse 83   Temp 97.7 F (36.5 C)   Resp 16   SpO2 95%   Physical Exam Vitals and nursing note reviewed.   Gen: NAD Eyes: PERRL, EOMI HEENT: no oropharyngeal swelling Neck: trachea midline Resp: Slightly diminished at bilateral lung bases, no wheezes noted Card: RRR, no murmurs, rubs, or gallops Abd: nontender, nondistended Extremities: no calf tenderness, no edema Vascular: 2+ radial pulses bilaterally, 2+ DP pulses bilaterally Skin: no rashes Psyc: acting appropriately   (all labs ordered are listed, but only abnormal results are displayed) Labs Reviewed   BASIC METABOLIC PANEL WITH GFR - Abnormal; Notable for the following components:      Result Value   Glucose, Bld 160 (*)    All other components within normal limits  TROPONIN T, HIGH SENSITIVITY - Abnormal; Notable for the following components:   Troponin T High Sensitivity 30 (*)    All other components within normal limits  TROPONIN T, HIGH SENSITIVITY - Abnormal; Notable for the following components:   Troponin T High Sensitivity 29 (*)    All other components within normal limits  CBC    EKG: EKG Interpretation Date/Time:  Friday September 11 2024 07:50:41 EDT Ventricular Rate:  87 PR Interval:  205 QRS Duration:  100 QT Interval:  362 QTC Calculation: 436 R Axis:   6  Text Interpretation: Sinus rhythm Ventricular premature complex Abnormal R-wave progression, early transition Confirmed by Ula Prentice [phone]) on 09/11/2024 8:06:02 AM  Radiology: DG Chest 2 View Result Date: 09/11/2024 CLINICAL DATA:  Shortness of breath. EXAM: CHEST - 2 VIEW COMPARISON:  June 01, 2018. FINDINGS: The heart size and mediastinal contours are within normal limits. Both lungs are clear. The visualized skeletal structures are unremarkable. IMPRESSION: No active cardiopulmonary disease. Electronically Signed   By: Lynwood Landy Raddle M.D.   On: 09/11/2024 08:16     Procedures   Medications Ordered in the ED  ipratropium-albuterol (DUONEB) 0.5-2.5 (3) MG/3ML nebulizer solution 3 mL (3 mLs Nebulization Given 09/11/24 0807)  Medical Decision Making 89 year old male with no reported past medical history presented to the emergency department today with shortness of breath after a dust exposure earlier.  Patient does report a very remote smoking history but denies any other medical problems.  I will further evaluate the patient here with an EKG to screen for cardiac etiology.  Obtain an x-ray here to evaluate for pulmonary edema or pneumothorax as well as pulmonary  infiltrates.  Will obtain basic labs to eval for anemia or electrolyte abnormalities.  I will give the patient a DuoNeb here to see if this helps the symptoms.  I will reevaluate for ultimate disposition.  Based on his reassuring initial evaluation with his stable vital signs for for pulmonary embolism is low at this time.  The patient's EKG has some nonspecific ST-T changes.  Initial troponin was 30.  This is downtrending so suspect this is the patient's baseline troponin.  He is denying any chest pain.  Patient did report significant improvement with the DuoNeb.  Will send him home with albuterol here and have him follow-up with pulmonology as an outpatient.  Amount and/or Complexity of Data Reviewed Labs: ordered. Radiology: ordered.  Risk Prescription drug management.        Final diagnoses:  Shortness of breath    ED Discharge Orders          Ordered    albuterol (VENTOLIN HFA) 108 (90 Base) MCG/ACT inhaler  Every 4 hours PRN        09/11/24 1036               Ula Prentice SAUNDERS, MD 09/11/24 1037

## 2024-09-11 NOTE — Discharge Instructions (Signed)
 Your workup today was reassuring.  Please use 2 puffs of the albuterol every 4 hours as needed for shortness of breath and follow-up with the lung doctor at the number provided.  Return to the ER for worsening symptoms.

## 2024-09-11 NOTE — ED Triage Notes (Signed)
 Reports inhaling popcorn ceiling pieces while construction is being completed at home. Reports feeling like he can't breathe as deeply   Denies chest pain, NV, headaches, fevers No obvious signs of respiratory distress upon assessment

## 2024-09-17 DIAGNOSIS — L720 Epidermal cyst: Secondary | ICD-10-CM | POA: Diagnosis not present

## 2024-09-17 DIAGNOSIS — L821 Other seborrheic keratosis: Secondary | ICD-10-CM | POA: Diagnosis not present

## 2024-09-17 DIAGNOSIS — L814 Other melanin hyperpigmentation: Secondary | ICD-10-CM | POA: Diagnosis not present

## 2024-09-17 DIAGNOSIS — D1801 Hemangioma of skin and subcutaneous tissue: Secondary | ICD-10-CM | POA: Diagnosis not present

## 2024-09-17 DIAGNOSIS — D225 Melanocytic nevi of trunk: Secondary | ICD-10-CM | POA: Diagnosis not present

## 2024-09-17 DIAGNOSIS — D692 Other nonthrombocytopenic purpura: Secondary | ICD-10-CM | POA: Diagnosis not present

## 2024-09-17 DIAGNOSIS — D485 Neoplasm of uncertain behavior of skin: Secondary | ICD-10-CM | POA: Diagnosis not present

## 2024-09-17 DIAGNOSIS — Z85828 Personal history of other malignant neoplasm of skin: Secondary | ICD-10-CM | POA: Diagnosis not present

## 2024-09-17 DIAGNOSIS — C44319 Basal cell carcinoma of skin of other parts of face: Secondary | ICD-10-CM | POA: Diagnosis not present

## 2024-09-17 DIAGNOSIS — D2262 Melanocytic nevi of left upper limb, including shoulder: Secondary | ICD-10-CM | POA: Diagnosis not present

## 2024-09-17 DIAGNOSIS — D2272 Melanocytic nevi of left lower limb, including hip: Secondary | ICD-10-CM | POA: Diagnosis not present

## 2024-09-17 DIAGNOSIS — L570 Actinic keratosis: Secondary | ICD-10-CM | POA: Diagnosis not present
